# Patient Record
Sex: Female | Born: 1945 | Race: White | Hispanic: No | Marital: Married
Health system: Southern US, Community
[De-identification: ages and names within clinical notes are randomized; demographics above are authoritative.]

---

## 1998-07-22 ENCOUNTER — Encounter: Payer: Self-pay | Admitting: Obstetrics & Gynecology

## 1998-07-22 ENCOUNTER — Ambulatory Visit (HOSPITAL_COMMUNITY): Admission: RE | Admit: 1998-07-22 | Discharge: 1998-07-22 | Payer: Self-pay | Admitting: Obstetrics & Gynecology

## 1999-03-06 ENCOUNTER — Other Ambulatory Visit: Admission: RE | Admit: 1999-03-06 | Discharge: 1999-03-06 | Payer: Self-pay | Admitting: Obstetrics & Gynecology

## 1999-07-25 ENCOUNTER — Ambulatory Visit (HOSPITAL_COMMUNITY): Admission: RE | Admit: 1999-07-25 | Discharge: 1999-07-25 | Payer: Self-pay | Admitting: Obstetrics & Gynecology

## 1999-07-25 ENCOUNTER — Encounter: Payer: Self-pay | Admitting: Obstetrics & Gynecology

## 1999-07-27 ENCOUNTER — Encounter: Payer: Self-pay | Admitting: Obstetrics & Gynecology

## 1999-07-27 ENCOUNTER — Ambulatory Visit (HOSPITAL_COMMUNITY): Admission: RE | Admit: 1999-07-27 | Discharge: 1999-07-27 | Payer: Self-pay | Admitting: Obstetrics & Gynecology

## 2000-08-08 ENCOUNTER — Other Ambulatory Visit: Admission: RE | Admit: 2000-08-08 | Discharge: 2000-08-08 | Payer: Self-pay | Admitting: Obstetrics & Gynecology

## 2001-07-03 ENCOUNTER — Encounter: Payer: Self-pay | Admitting: Obstetrics & Gynecology

## 2001-07-03 ENCOUNTER — Encounter: Admission: RE | Admit: 2001-07-03 | Discharge: 2001-07-03 | Payer: Self-pay | Admitting: Family Medicine

## 2001-07-08 ENCOUNTER — Emergency Department (HOSPITAL_COMMUNITY): Admission: EM | Admit: 2001-07-08 | Discharge: 2001-07-08 | Payer: Self-pay | Admitting: Emergency Medicine

## 2001-07-08 ENCOUNTER — Encounter: Payer: Self-pay | Admitting: Emergency Medicine

## 2001-09-24 ENCOUNTER — Other Ambulatory Visit: Admission: RE | Admit: 2001-09-24 | Discharge: 2001-09-24 | Payer: Self-pay | Admitting: Obstetrics & Gynecology

## 2002-05-25 ENCOUNTER — Encounter: Admission: RE | Admit: 2002-05-25 | Discharge: 2002-08-23 | Payer: Self-pay

## 2002-10-01 ENCOUNTER — Other Ambulatory Visit: Admission: RE | Admit: 2002-10-01 | Discharge: 2002-10-01 | Payer: Self-pay | Admitting: Obstetrics & Gynecology

## 2003-10-28 ENCOUNTER — Other Ambulatory Visit: Admission: RE | Admit: 2003-10-28 | Discharge: 2003-10-28 | Payer: Self-pay | Admitting: Obstetrics & Gynecology

## 2004-12-20 ENCOUNTER — Other Ambulatory Visit: Admission: RE | Admit: 2004-12-20 | Discharge: 2004-12-20 | Payer: Self-pay | Admitting: Obstetrics & Gynecology

## 2007-12-24 ENCOUNTER — Encounter: Payer: Self-pay | Admitting: Internal Medicine

## 2009-12-12 ENCOUNTER — Ambulatory Visit (HOSPITAL_BASED_OUTPATIENT_CLINIC_OR_DEPARTMENT_OTHER): Admission: RE | Admit: 2009-12-12 | Discharge: 2009-12-12 | Payer: Self-pay | Admitting: Orthopedic Surgery

## 2010-11-14 LAB — POCT HEMOGLOBIN-HEMACUE: Hemoglobin: 12.8 g/dL (ref 12.0–15.0)

## 2010-11-15 LAB — BASIC METABOLIC PANEL
BUN: 13 mg/dL (ref 6–23)
CO2: 30 mEq/L (ref 19–32)
Calcium: 9.5 mg/dL (ref 8.4–10.5)
Creatinine, Ser: 0.65 mg/dL (ref 0.4–1.2)
GFR calc non Af Amer: >60 mL/min (ref >60)
Glucose, Bld: 90 mg/dL (ref 70–99)

## 2011-03-21 ENCOUNTER — Other Ambulatory Visit: Payer: Self-pay | Admitting: Obstetrics & Gynecology

## 2011-03-21 DIAGNOSIS — R928 Other abnormal and inconclusive findings on diagnostic imaging of breast: Secondary | ICD-10-CM

## 2011-04-02 ENCOUNTER — Ambulatory Visit
Admission: RE | Admit: 2011-04-02 | Discharge: 2011-04-02 | Disposition: A | Discharge status: Home / Self Care | Payer: Medicare Other | Source: Ambulatory Visit | Attending: Obstetrics & Gynecology | Admitting: Obstetrics & Gynecology

## 2011-04-02 DIAGNOSIS — R928 Other abnormal and inconclusive findings on diagnostic imaging of breast: Secondary | ICD-10-CM

## 2012-04-10 ENCOUNTER — Other Ambulatory Visit: Payer: Self-pay | Admitting: Obstetrics & Gynecology

## 2012-04-10 DIAGNOSIS — R928 Other abnormal and inconclusive findings on diagnostic imaging of breast: Secondary | ICD-10-CM

## 2012-04-17 ENCOUNTER — Ambulatory Visit
Admission: RE | Admit: 2012-04-17 | Discharge: 2012-04-17 | Disposition: A | Discharge status: Home / Self Care | Payer: Medicare Other | Source: Ambulatory Visit | Attending: Obstetrics & Gynecology | Admitting: Obstetrics & Gynecology

## 2012-04-17 DIAGNOSIS — R928 Other abnormal and inconclusive findings on diagnostic imaging of breast: Secondary | ICD-10-CM

## 2013-02-25 ENCOUNTER — Other Ambulatory Visit: Payer: Self-pay | Admitting: Obstetrics & Gynecology

## 2013-02-25 DIAGNOSIS — N6002 Solitary cyst of left breast: Secondary | ICD-10-CM

## 2013-03-06 ENCOUNTER — Ambulatory Visit
Admission: RE | Admit: 2013-03-06 | Discharge: 2013-03-06 | Disposition: A | Discharge status: Home / Self Care | Payer: Medicare Other | Source: Ambulatory Visit | Attending: Obstetrics & Gynecology | Admitting: Obstetrics & Gynecology

## 2013-03-06 ENCOUNTER — Other Ambulatory Visit: Payer: Self-pay | Admitting: Obstetrics & Gynecology

## 2013-03-06 DIAGNOSIS — N6002 Solitary cyst of left breast: Secondary | ICD-10-CM

## 2013-05-13 ENCOUNTER — Other Ambulatory Visit: Payer: Self-pay | Admitting: Obstetrics & Gynecology

## 2013-05-13 DIAGNOSIS — R928 Other abnormal and inconclusive findings on diagnostic imaging of breast: Secondary | ICD-10-CM

## 2013-05-18 ENCOUNTER — Ambulatory Visit
Admission: RE | Admit: 2013-05-18 | Discharge: 2013-05-18 | Disposition: A | Discharge status: Home / Self Care | Payer: Medicare Other | Source: Ambulatory Visit | Attending: Obstetrics & Gynecology | Admitting: Obstetrics & Gynecology

## 2013-05-18 DIAGNOSIS — R928 Other abnormal and inconclusive findings on diagnostic imaging of breast: Secondary | ICD-10-CM

## 2014-05-19 ENCOUNTER — Other Ambulatory Visit: Payer: Self-pay | Admitting: Obstetrics & Gynecology

## 2014-05-19 DIAGNOSIS — R928 Other abnormal and inconclusive findings on diagnostic imaging of breast: Secondary | ICD-10-CM

## 2014-05-27 ENCOUNTER — Other Ambulatory Visit: Payer: Self-pay | Admitting: Obstetrics & Gynecology

## 2014-05-27 ENCOUNTER — Ambulatory Visit
Admission: RE | Admit: 2014-05-27 | Discharge: 2014-05-27 | Disposition: A | Discharge status: Home / Self Care | Payer: Medicare Other | Source: Ambulatory Visit | Attending: Obstetrics & Gynecology | Admitting: Obstetrics & Gynecology

## 2014-05-27 DIAGNOSIS — R928 Other abnormal and inconclusive findings on diagnostic imaging of breast: Secondary | ICD-10-CM

## 2014-05-27 DIAGNOSIS — N63 Unspecified lump in unspecified breast: Secondary | ICD-10-CM

## 2014-05-31 ENCOUNTER — Other Ambulatory Visit: Payer: Self-pay | Admitting: Obstetrics & Gynecology

## 2014-05-31 DIAGNOSIS — N63 Unspecified lump in unspecified breast: Secondary | ICD-10-CM

## 2014-06-01 ENCOUNTER — Ambulatory Visit
Admission: RE | Admit: 2014-06-01 | Discharge: 2014-06-01 | Disposition: A | Discharge status: Home / Self Care | Payer: Medicare Other | Source: Ambulatory Visit | Attending: Obstetrics & Gynecology | Admitting: Obstetrics & Gynecology

## 2014-06-01 DIAGNOSIS — N63 Unspecified lump in unspecified breast: Secondary | ICD-10-CM

## 2014-10-25 ENCOUNTER — Other Ambulatory Visit: Payer: Self-pay | Admitting: Obstetrics & Gynecology

## 2014-10-25 DIAGNOSIS — N63 Unspecified lump in unspecified breast: Secondary | ICD-10-CM

## 2014-11-26 ENCOUNTER — Other Ambulatory Visit: Payer: Self-pay | Admitting: Obstetrics and Gynecology

## 2014-11-26 DIAGNOSIS — N63 Unspecified lump in unspecified breast: Secondary | ICD-10-CM

## 2014-12-01 ENCOUNTER — Ambulatory Visit
Admission: RE | Admit: 2014-12-01 | Discharge: 2014-12-01 | Disposition: A | Discharge status: Home / Self Care | Payer: Medicare Other | Source: Ambulatory Visit | Attending: Obstetrics & Gynecology | Admitting: Obstetrics & Gynecology

## 2014-12-01 DIAGNOSIS — N63 Unspecified lump in unspecified breast: Secondary | ICD-10-CM

## 2018-08-28 ENCOUNTER — Other Ambulatory Visit: Payer: Self-pay | Admitting: Obstetrics & Gynecology

## 2018-08-28 DIAGNOSIS — R928 Other abnormal and inconclusive findings on diagnostic imaging of breast: Secondary | ICD-10-CM

## 2018-09-02 ENCOUNTER — Ambulatory Visit
Admission: RE | Admit: 2018-09-02 | Discharge: 2018-09-02 | Disposition: A | Discharge status: Home / Self Care | Payer: Medicare Other | Source: Ambulatory Visit | Attending: Obstetrics & Gynecology | Admitting: Obstetrics & Gynecology

## 2018-09-02 ENCOUNTER — Ambulatory Visit: Payer: Medicare Other

## 2018-09-02 DIAGNOSIS — R928 Other abnormal and inconclusive findings on diagnostic imaging of breast: Secondary | ICD-10-CM

## 2021-10-11 ENCOUNTER — Other Ambulatory Visit: Payer: Self-pay | Admitting: Obstetrics & Gynecology

## 2021-10-11 DIAGNOSIS — N6320 Unspecified lump in the left breast, unspecified quadrant: Secondary | ICD-10-CM

## 2021-10-23 ENCOUNTER — Ambulatory Visit
Admission: RE | Admit: 2021-10-23 | Discharge: 2021-10-23 | Disposition: A | Discharge status: Home / Self Care | Payer: Medicare Other | Source: Ambulatory Visit | Attending: Obstetrics & Gynecology | Admitting: Obstetrics & Gynecology

## 2021-10-23 DIAGNOSIS — N6320 Unspecified lump in the left breast, unspecified quadrant: Secondary | ICD-10-CM

## 2022-10-23 IMAGING — MG MM DIGITAL DIAGNOSTIC UNILAT*L* W/ TOMO W/ CAD
6 series · 6 of 18 positions shown · non-contrast
Comparison: Previous exam(s).

CLINICAL DATA: Patient presents for palpable abnormality within the
anterior left breast.

EXAM:
DIGITAL DIAGNOSTIC UNILATERAL LEFT MAMMOGRAM WITH TOMOSYNTHESIS AND
CAD; ULTRASOUND LEFT BREAST LIMITED
TECHNIQUE: Left digital diagnostic mammography and breast tomosynthesis was
performed. The images were evaluated with computer-aided detection.;
Targeted ultrasound examination of the left breast was performed.

[L TAN synth-2D]
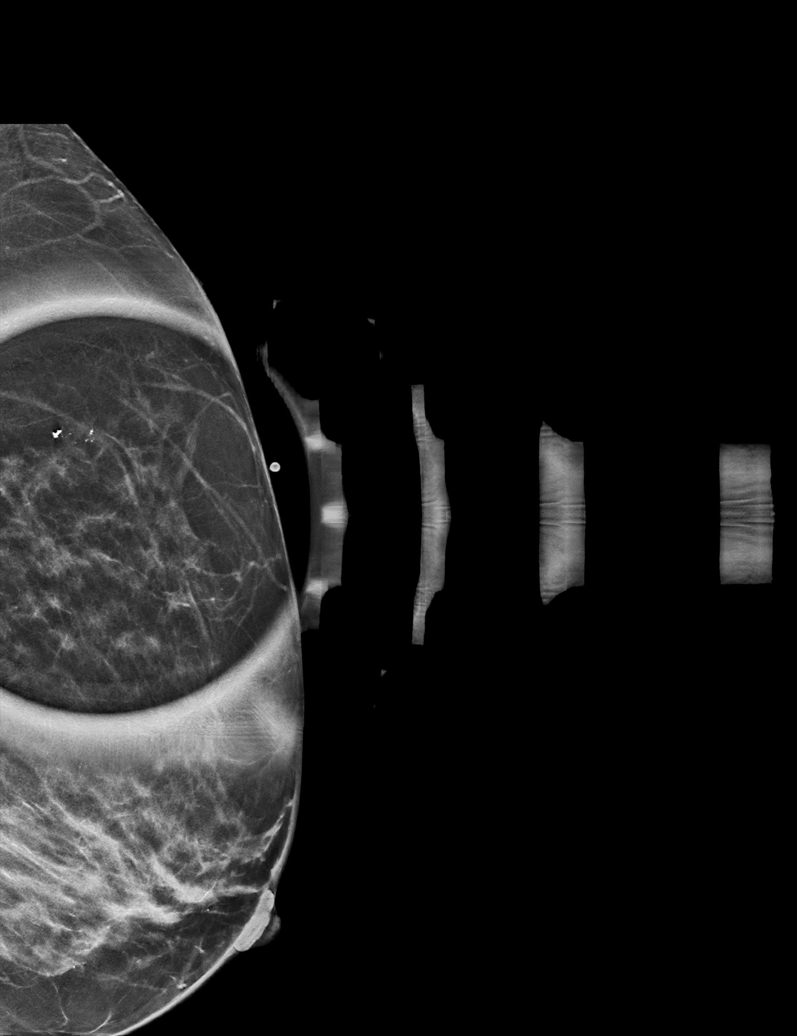

[L MLO synth-2D]
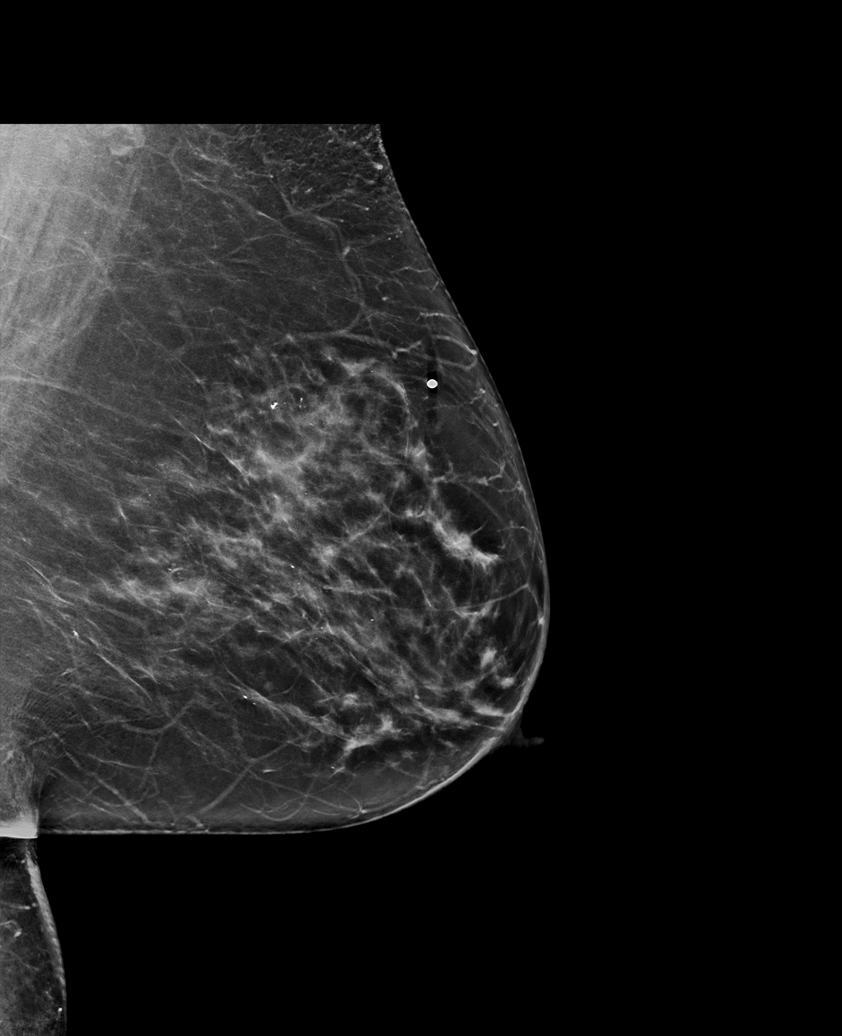

[L CC synth-2D]
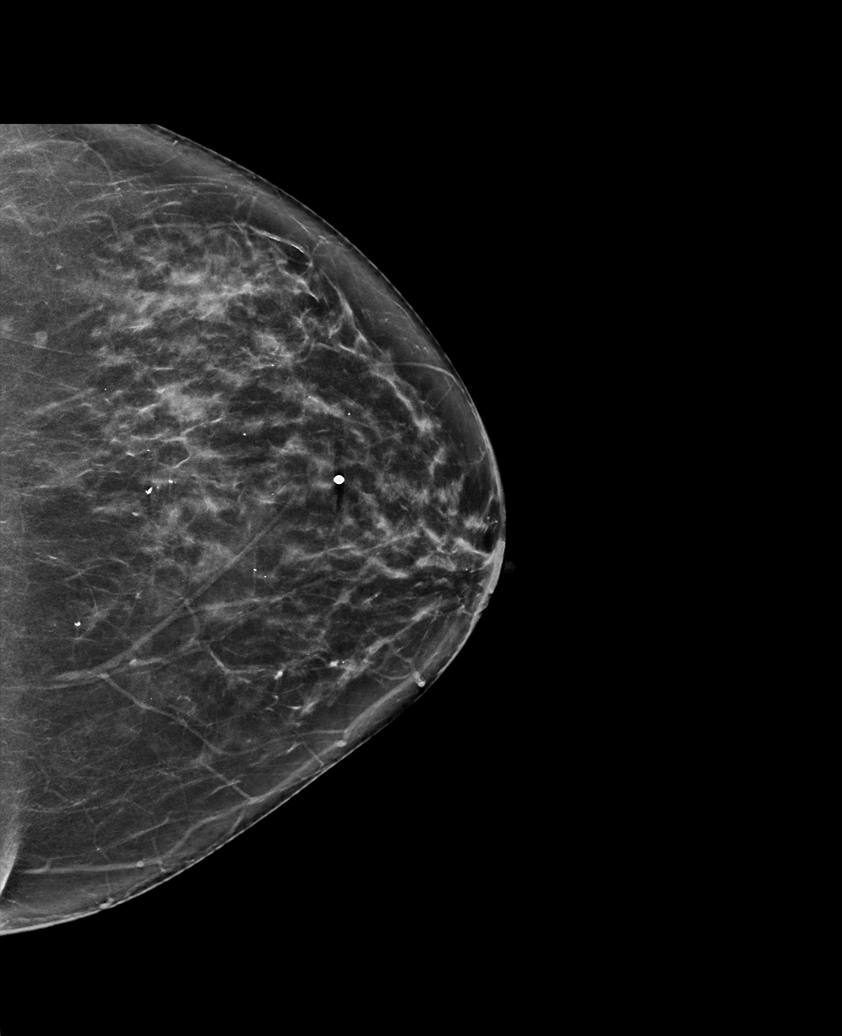

[L TAN tomo · tomo slice 24/47.0]
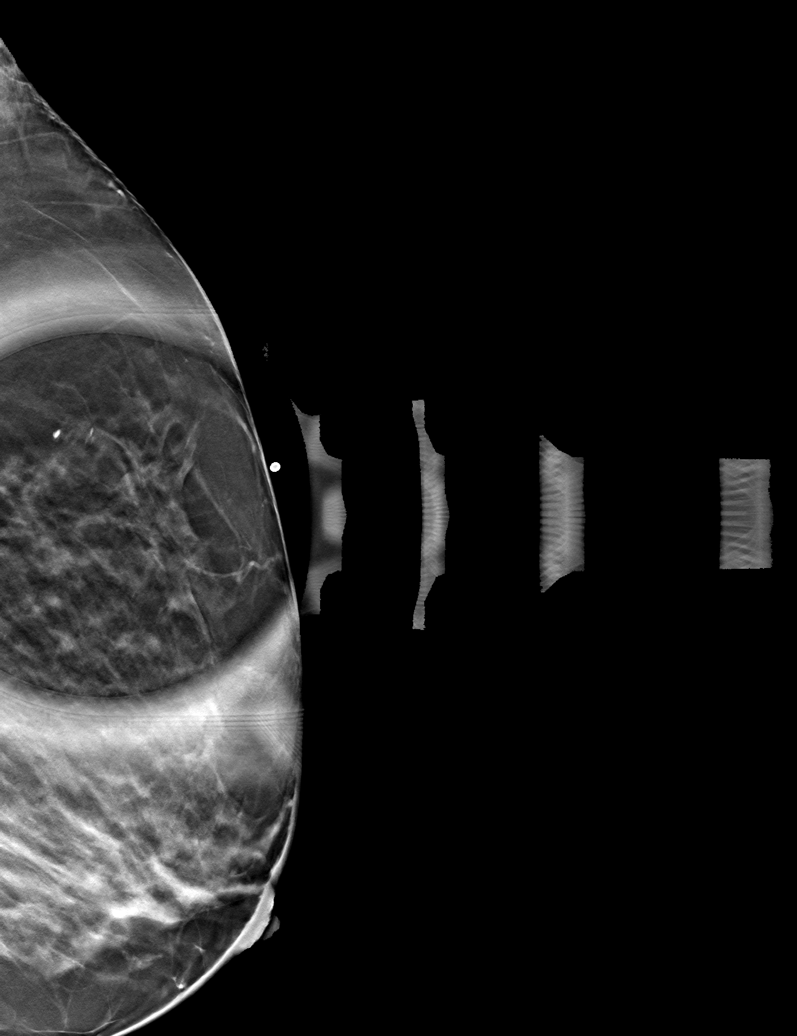

[L MLO tomo · tomo slice 37/73.0]
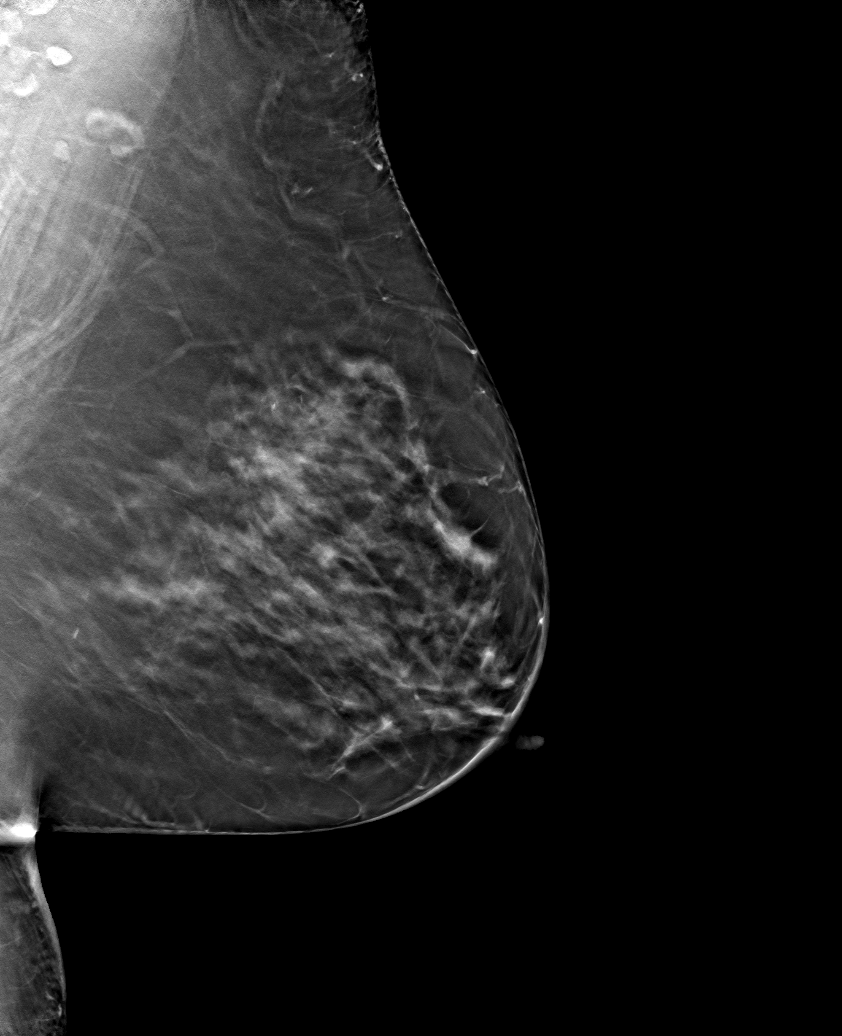

[L CC tomo · tomo slice 33/66.0]
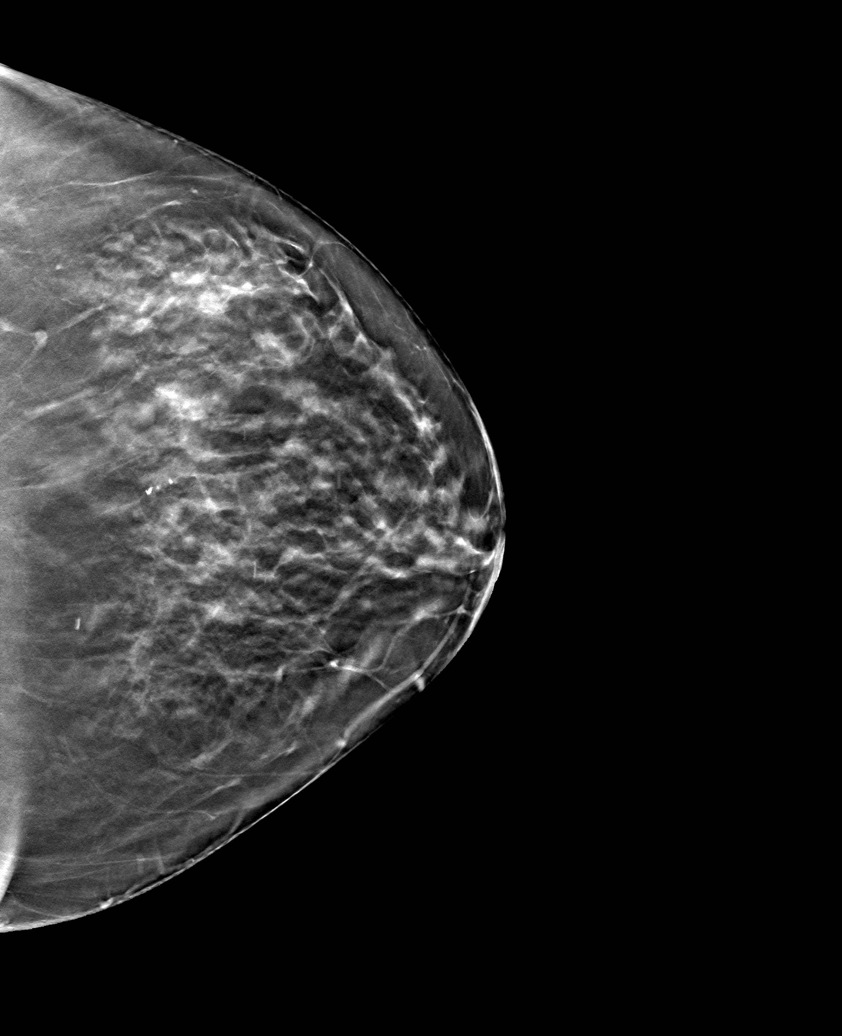

[6 of 18 positions shown; findings below may reference images not displayed]

ACR Breast Density Category c: The breast tissue is heterogeneously
dense, which may obscure small masses.
FINDINGS: No concerning masses, calcifications or nonsurgical distortion
identified within the left breast. No suspicious abnormality
underlying the palpable marker left breast.

On physical exam, no discrete mass is palpated within the anterior
left breast.

Targeted ultrasound is performed, showing normal tissue without
suspicious mass within the anterior left breast. Additionally,
incidentally identified is a 6 x 3 x 4 mm cluster of cysts left
breast 11 o'clock position 6 cm from nipple.
IMPRESSION: No mammographic evidence for malignancy. No suspicious abnormality
at the site of palpable concern left breast.

RECOMMENDATION:
Return to annual screening mammography [DATE].

I have discussed the findings and recommendations with the patient.
If applicable, a reminder letter will be sent to the patient
regarding the next appointment.

BI-RADS CATEGORY  2: Benign.
